# Patient Record
Sex: Female | Born: 1960 | Race: White | Hispanic: No | Marital: Married | Smoking: Never smoker
Health system: Southern US, Community
[De-identification: ages and names within clinical notes are randomized; demographics above are authoritative.]

## PROBLEM LIST (undated history)

## (undated) DIAGNOSIS — R51 Headache: Secondary | ICD-10-CM

## (undated) DIAGNOSIS — H539 Unspecified visual disturbance: Secondary | ICD-10-CM

## (undated) DIAGNOSIS — R519 Headache, unspecified: Secondary | ICD-10-CM

## (undated) DIAGNOSIS — K769 Liver disease, unspecified: Secondary | ICD-10-CM

## (undated) DIAGNOSIS — G35 Multiple sclerosis: Secondary | ICD-10-CM

## (undated) DIAGNOSIS — K754 Autoimmune hepatitis: Secondary | ICD-10-CM

## (undated) DIAGNOSIS — G35D Multiple sclerosis, unspecified: Secondary | ICD-10-CM

## (undated) HISTORY — DX: Headache: R51

## (undated) HISTORY — DX: Unspecified visual disturbance: H53.9

## (undated) HISTORY — PX: OTHER SURGICAL HISTORY: SHX169

## (undated) HISTORY — DX: Autoimmune hepatitis: K75.4

## (undated) HISTORY — DX: Headache, unspecified: R51.9

## (undated) HISTORY — PX: VESICOVAGINAL FISTULA CLOSURE W/ TAH: SUR271

## (undated) HISTORY — PX: ANKLE SURGERY: SHX546

## (undated) HISTORY — DX: Liver disease, unspecified: K76.9

## (undated) HISTORY — DX: Multiple sclerosis, unspecified: G35.D

## (undated) HISTORY — PX: LAPAROSCOPIC APPENDECTOMY: SUR753

## (undated) HISTORY — DX: Multiple sclerosis: G35

---

## 1989-12-19 HISTORY — PX: LAPAROSCOPIC OOPHERECTOMY: SHX6507

## 1989-12-19 HISTORY — PX: TUBAL LIGATION: SHX77

## 2008-11-19 DIAGNOSIS — K759 Inflammatory liver disease, unspecified: Secondary | ICD-10-CM | POA: Insufficient documentation

## 2008-11-19 DIAGNOSIS — K861 Other chronic pancreatitis: Secondary | ICD-10-CM | POA: Insufficient documentation

## 2008-11-19 DIAGNOSIS — G35 Multiple sclerosis: Secondary | ICD-10-CM | POA: Insufficient documentation

## 2010-07-27 ENCOUNTER — Encounter: Admission: RE | Admit: 2010-07-27 | Discharge: 2010-07-27 | Payer: Self-pay | Admitting: Family Medicine

## 2012-06-01 ENCOUNTER — Other Ambulatory Visit: Payer: Self-pay | Admitting: Unknown Physician Specialty

## 2012-06-01 ENCOUNTER — Ambulatory Visit
Admission: RE | Admit: 2012-06-01 | Discharge: 2012-06-01 | Disposition: A | Discharge status: Home / Self Care | Payer: BC Managed Care – PPO | Source: Ambulatory Visit | Attending: Unknown Physician Specialty | Admitting: Unknown Physician Specialty

## 2012-06-01 DIAGNOSIS — T1490XA Injury, unspecified, initial encounter: Secondary | ICD-10-CM

## 2012-06-01 DIAGNOSIS — Z1231 Encounter for screening mammogram for malignant neoplasm of breast: Secondary | ICD-10-CM

## 2012-06-01 DIAGNOSIS — R52 Pain, unspecified: Secondary | ICD-10-CM

## 2012-06-12 ENCOUNTER — Inpatient Hospital Stay: Admission: RE | Admit: 2012-06-12 | Payer: Self-pay | Source: Ambulatory Visit

## 2012-10-29 IMAGING — CR DG KNEE COMPLETE 4+V*L*
4 series · 4 of 4 positions shown · non-contrast
Comparison: None.

CLINICAL DATA: Pain post injury 1 week ago

LEFT KNEE - COMPLETE 4+ VIEW

[view not recorded (1 of 4)]
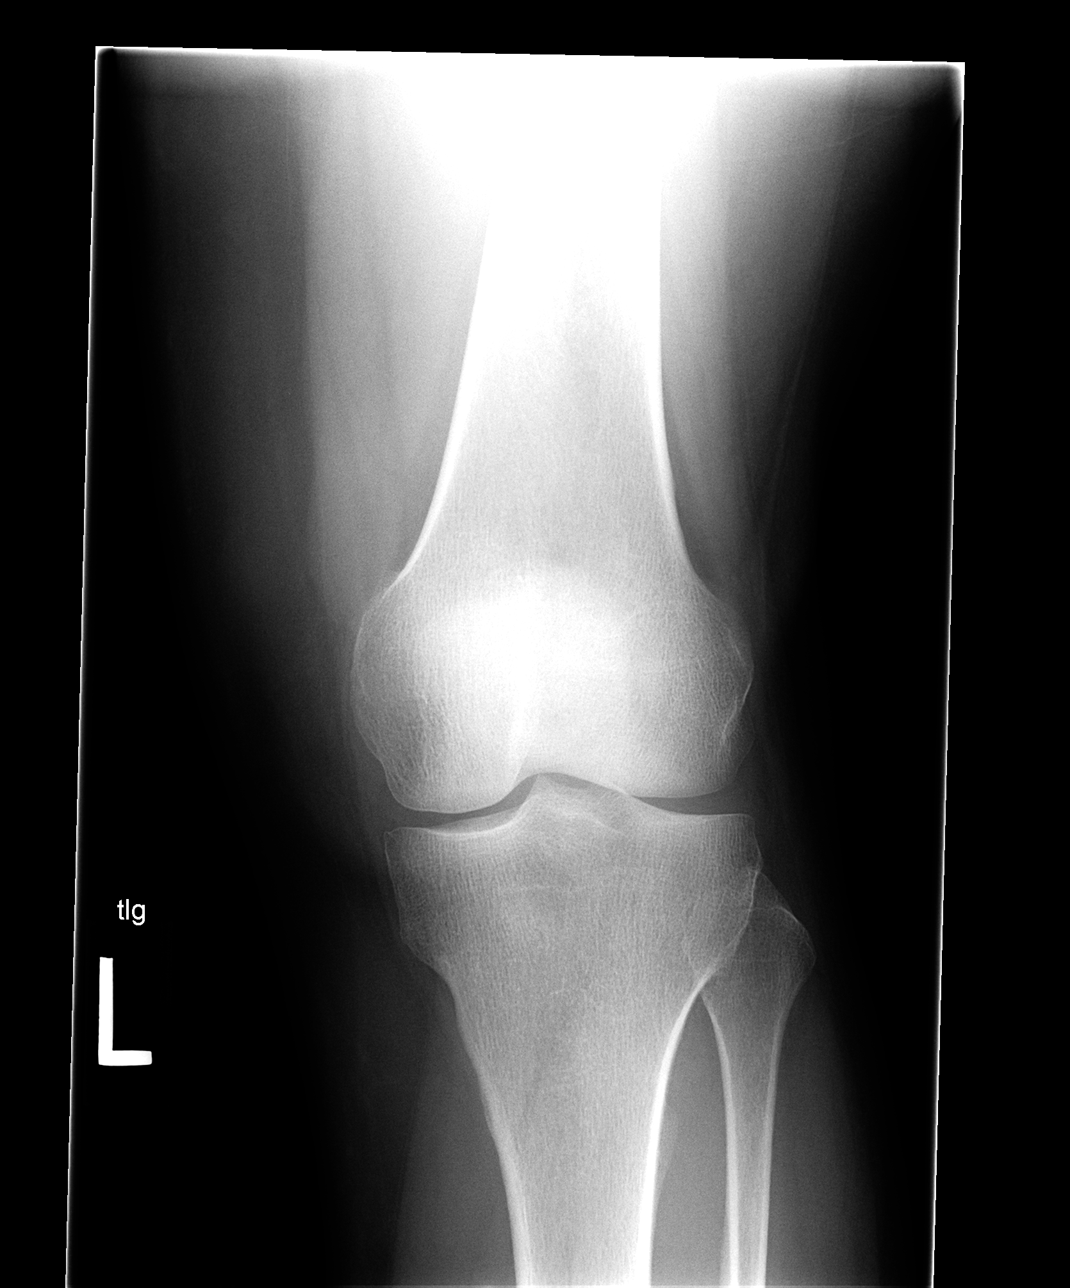

[view not recorded (2 of 4)]
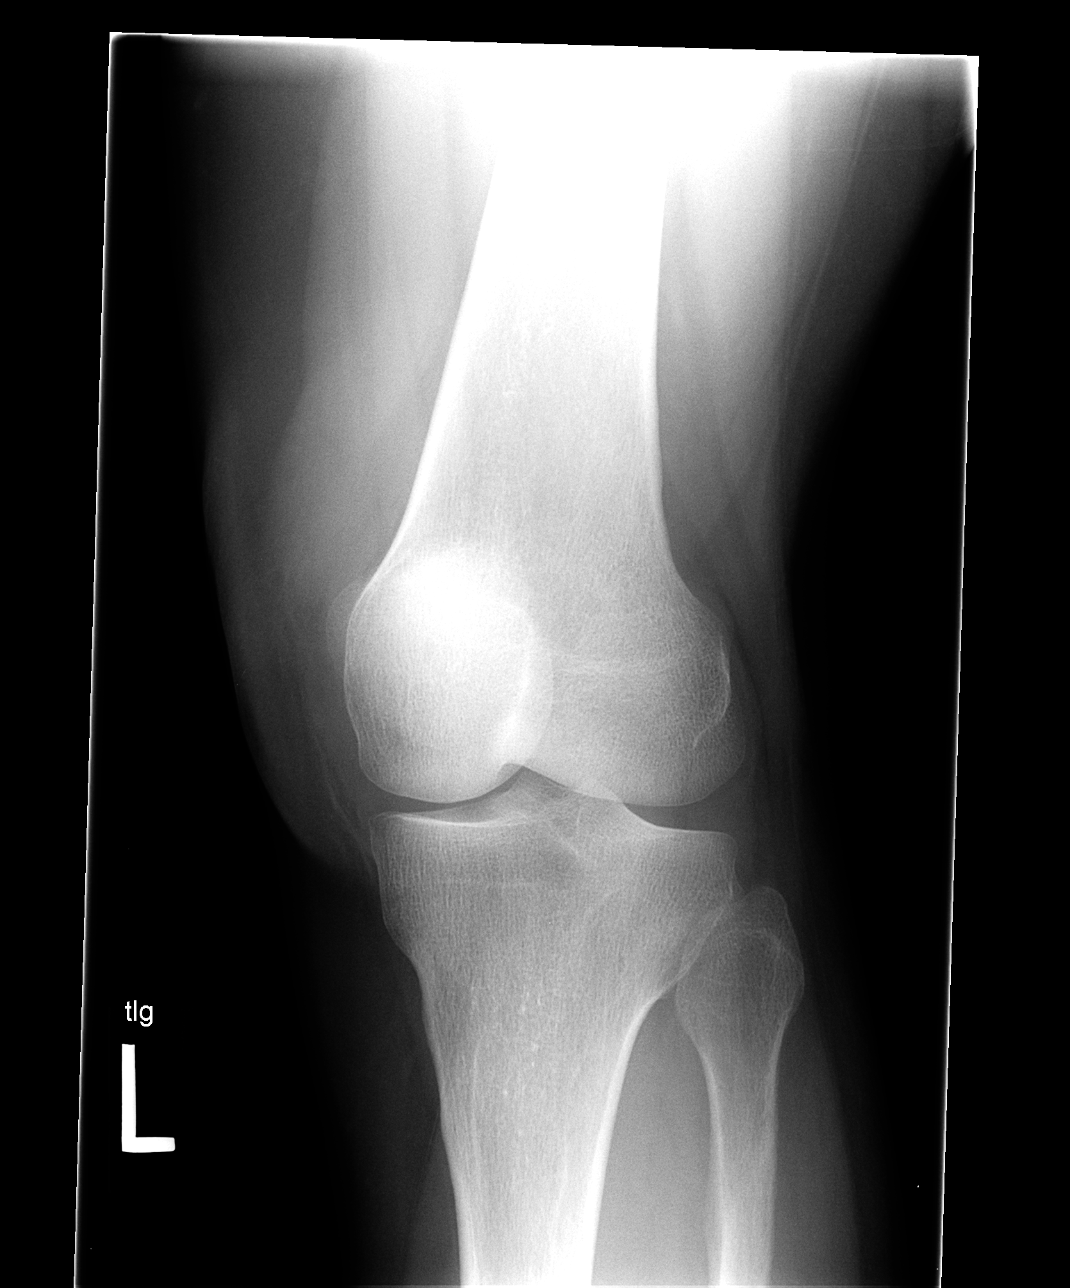

[view not recorded (3 of 4)]
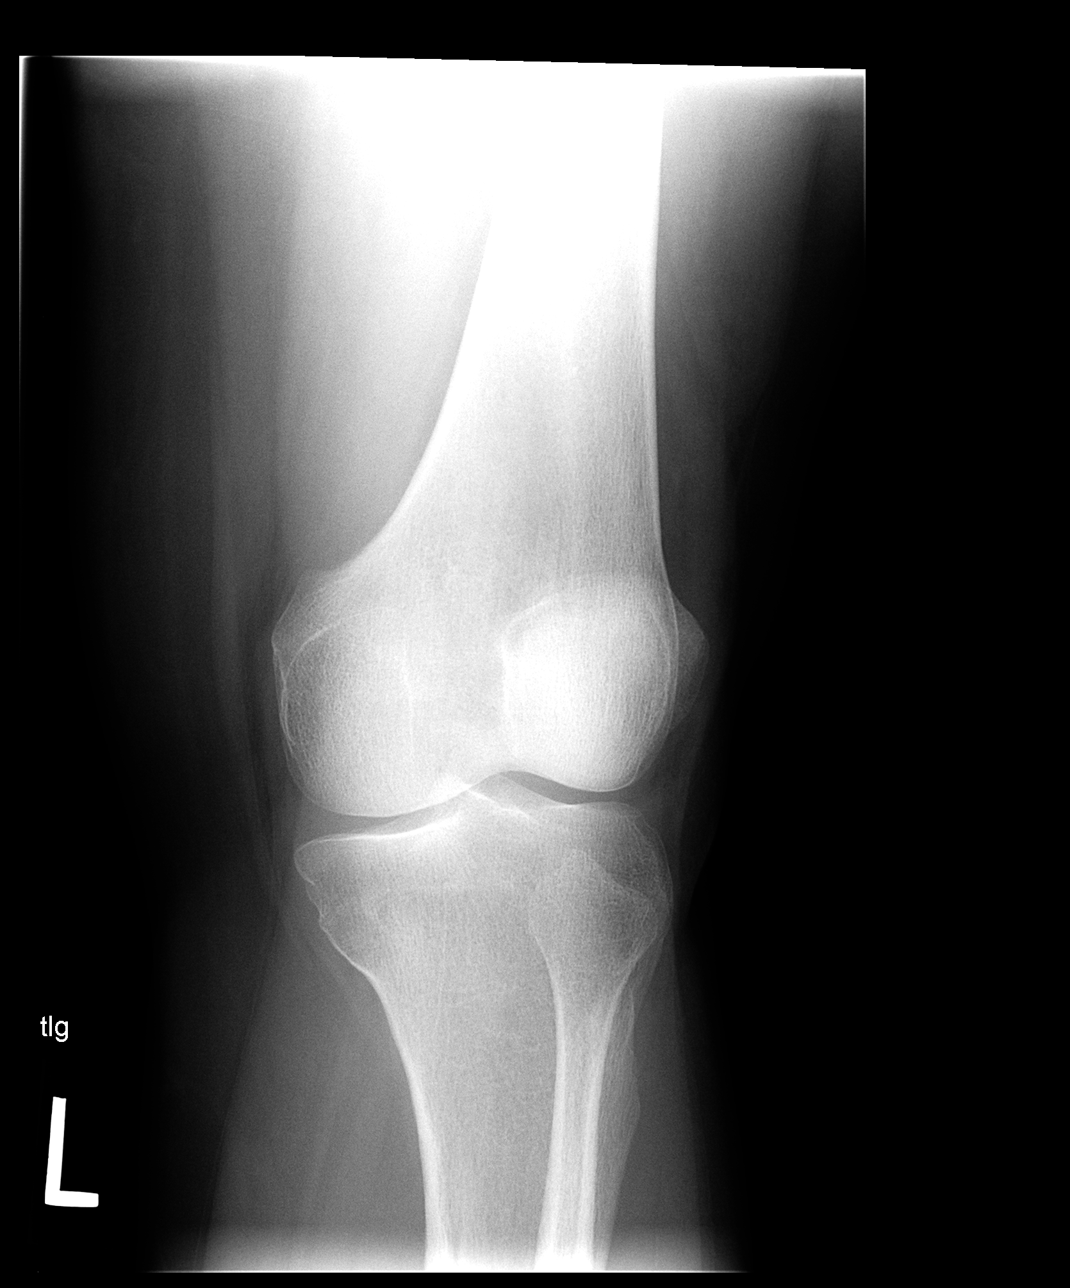

[view not recorded (4 of 4)]
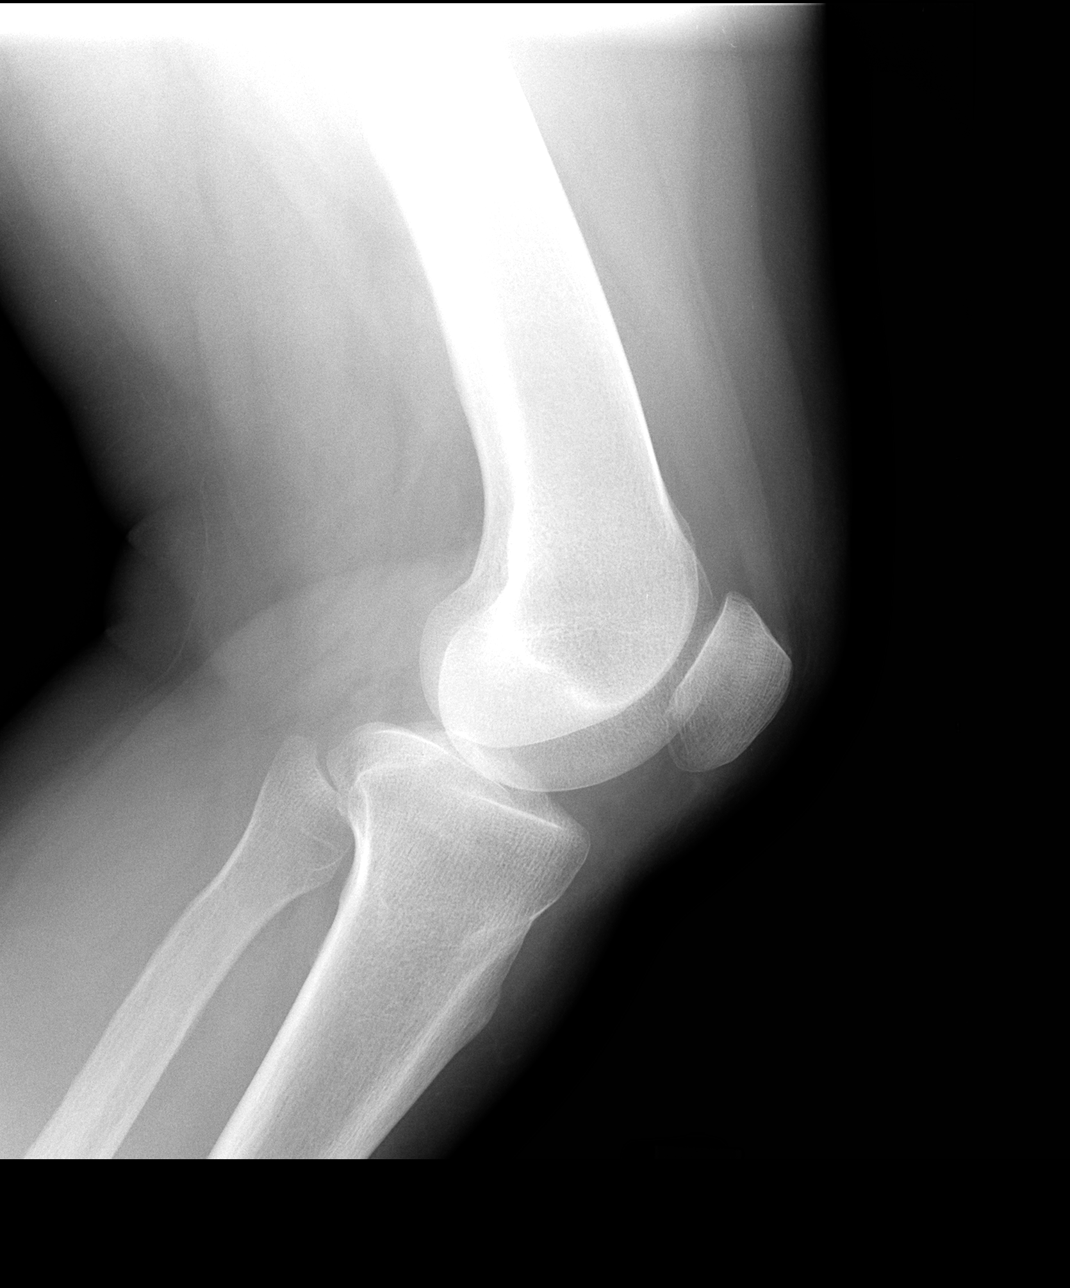

[4 of 4 positions shown; findings below may reference images not displayed]

FINDINGS: Four views of the left knee submitted.  No acute fracture
or subluxation.  Minimal narrowing of medial joint compartment.  No
pathologic calcifications are noted.  No joint effusion.  No
radiopaque foreign body.
IMPRESSION: No acute fracture or subluxation.  Minimal narrowing of medial
joint compartment.  No joint effusion.

## 2013-09-10 DIAGNOSIS — K7200 Acute and subacute hepatic failure without coma: Secondary | ICD-10-CM | POA: Insufficient documentation

## 2013-09-17 DIAGNOSIS — K754 Autoimmune hepatitis: Secondary | ICD-10-CM | POA: Insufficient documentation

## 2013-09-17 DIAGNOSIS — D696 Thrombocytopenia, unspecified: Secondary | ICD-10-CM | POA: Insufficient documentation

## 2013-09-17 DIAGNOSIS — F329 Major depressive disorder, single episode, unspecified: Secondary | ICD-10-CM | POA: Insufficient documentation

## 2013-09-17 DIAGNOSIS — F32A Depression, unspecified: Secondary | ICD-10-CM | POA: Insufficient documentation

## 2013-09-17 DIAGNOSIS — E039 Hypothyroidism, unspecified: Secondary | ICD-10-CM | POA: Insufficient documentation

## 2014-03-25 ENCOUNTER — Other Ambulatory Visit: Payer: Self-pay | Admitting: Family Medicine

## 2014-03-25 ENCOUNTER — Ambulatory Visit (INDEPENDENT_AMBULATORY_CARE_PROVIDER_SITE_OTHER): Payer: Medicare Other

## 2014-03-25 DIAGNOSIS — R229 Localized swelling, mass and lump, unspecified: Principal | ICD-10-CM

## 2014-03-25 DIAGNOSIS — IMO0002 Reserved for concepts with insufficient information to code with codable children: Secondary | ICD-10-CM

## 2014-03-25 DIAGNOSIS — R222 Localized swelling, mass and lump, trunk: Secondary | ICD-10-CM

## 2014-03-26 ENCOUNTER — Other Ambulatory Visit: Payer: Self-pay | Admitting: Family Medicine

## 2014-03-26 DIAGNOSIS — Z1231 Encounter for screening mammogram for malignant neoplasm of breast: Secondary | ICD-10-CM

## 2014-03-27 ENCOUNTER — Ambulatory Visit (INDEPENDENT_AMBULATORY_CARE_PROVIDER_SITE_OTHER): Payer: BC Managed Care – PPO

## 2014-03-27 DIAGNOSIS — Z1231 Encounter for screening mammogram for malignant neoplasm of breast: Secondary | ICD-10-CM

## 2014-08-22 IMAGING — US US MISC SOFT TISSUE
1 series · 9 of 9 positions shown · non-contrast
Comparison: None.

CLINICAL DATA: Palpable nodule in the right upper chest, which is
reported as not breast tissue.

EXAM:
SOFT TISSUE ULTRASOUND - MISCELLANEOUS
TECHNIQUE: Grayscale and color Doppler images were performed of the right upper
chest.

[Series 1: us misc soft tissue · 0.06mm/px · 9 of 9 slices shown]
[im 1/9]
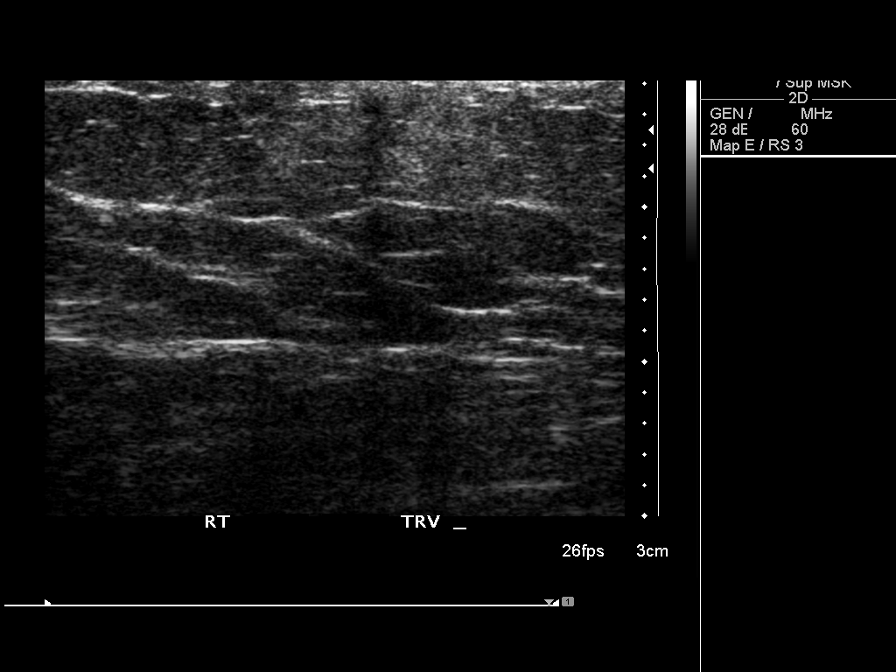
[im 2/9]
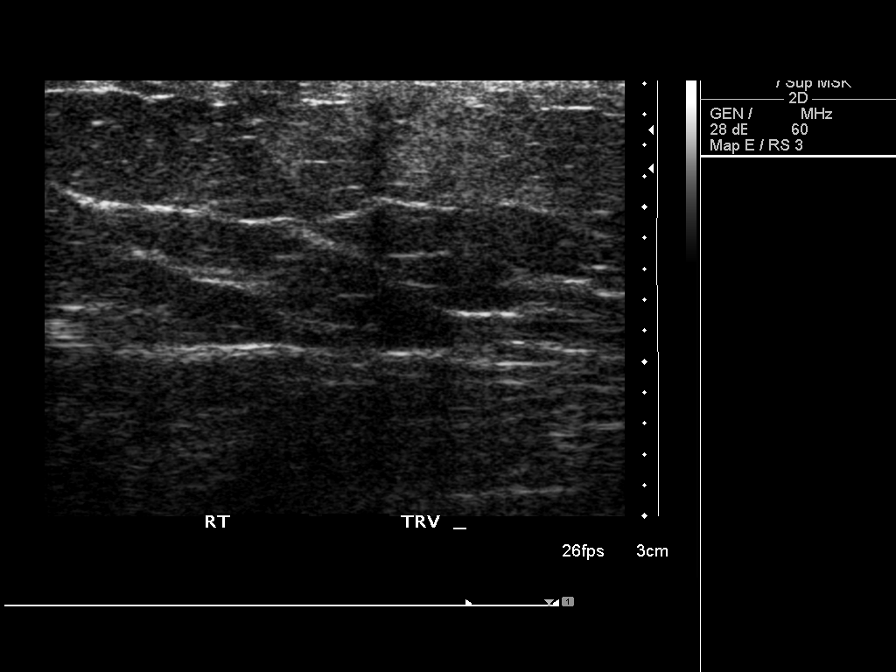
[im 3/9]
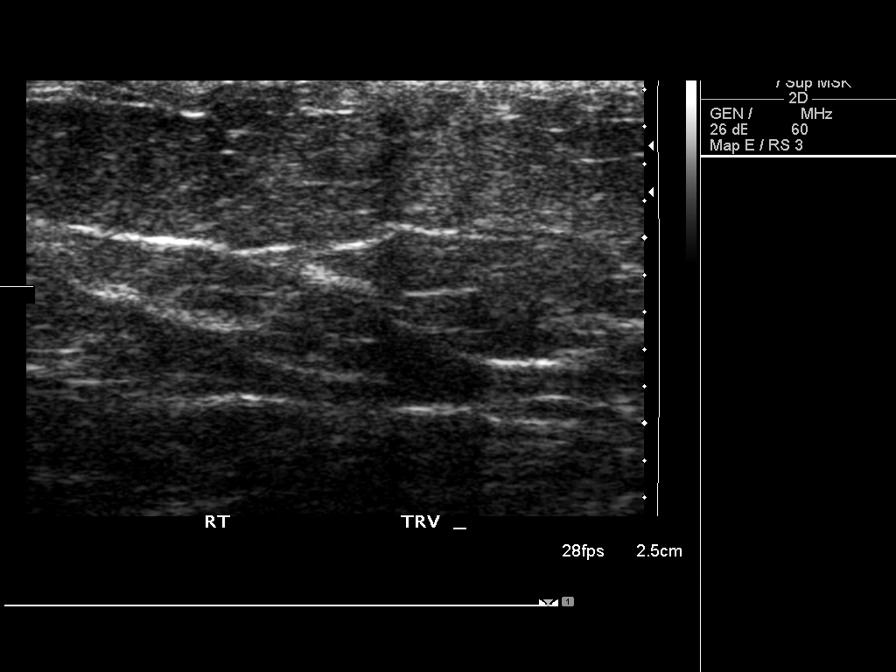
[im 4/9]
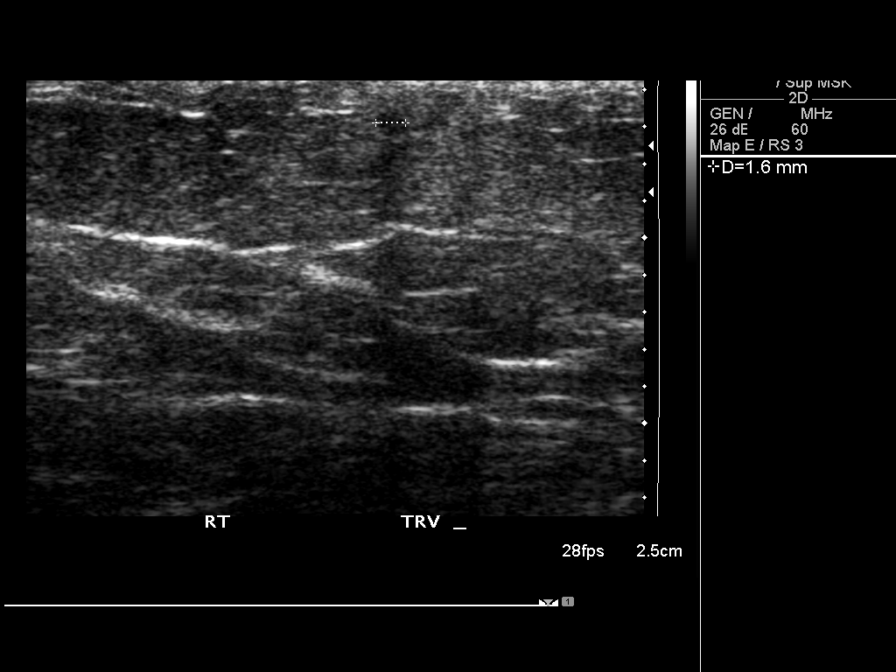
[im 5/9]
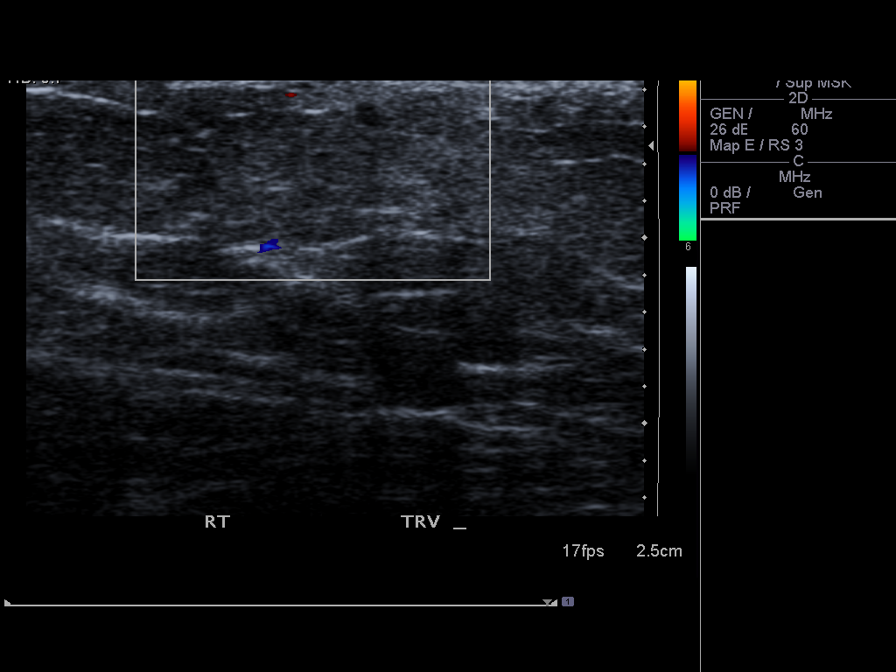
[im 6/9]
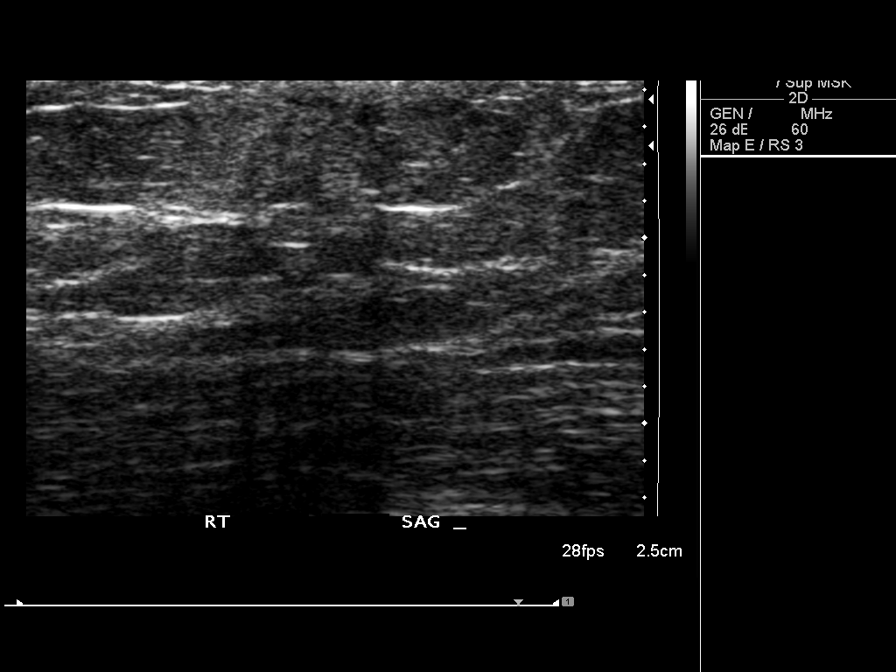
[im 7/9]
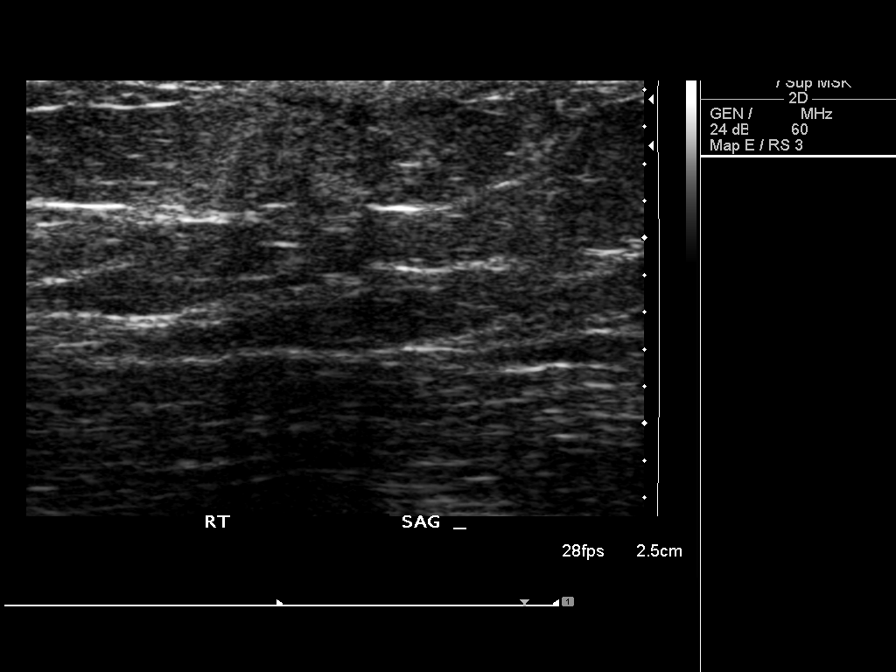
[im 8/9]
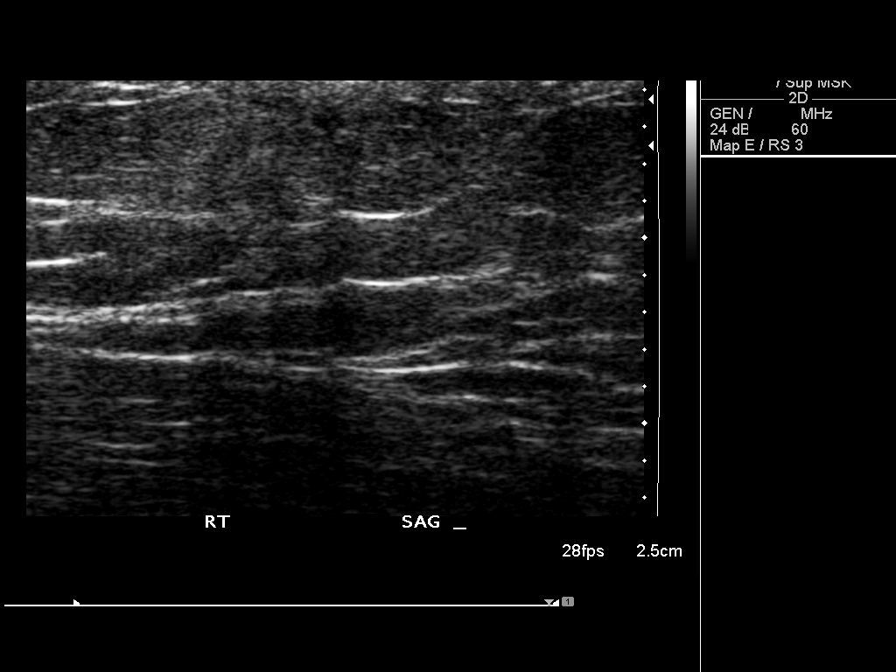
[im 9/9]
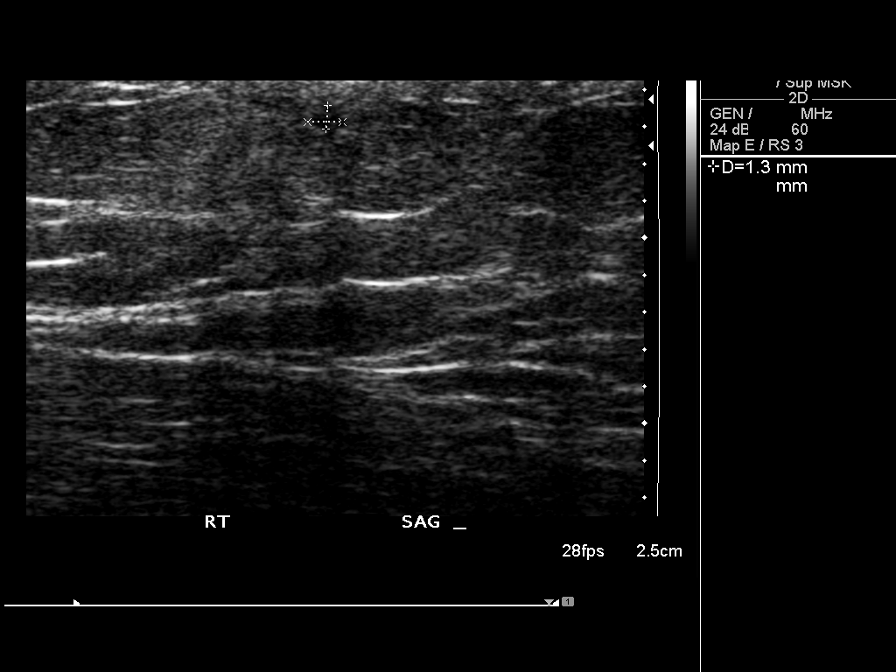

[9 of 9 positions shown; findings below may reference images not displayed]

FINDINGS: There is a small superficial nonspecific hypoechoic nodule at site
of palpable concern in the right upper chest measuring 0.2 x 0.1 x
0.2 cm. This is too small to characterize.
IMPRESSION: Nonspecific tiny superficial nodule at site of palpable in the right
upper chest at site of palpable concern measuring 0.2 cm. (if this
small nodule is felt to reside within the breast (although it is
reported as not breast tissue), then the patient could be referred
for diagnostic mammogram/ possible ultrasound.

## 2015-04-09 ENCOUNTER — Telehealth: Payer: Self-pay | Admitting: Neurology

## 2015-04-09 NOTE — Telephone Encounter (Signed)
LMOM for Phoebie (identified vm) that I have not received med. records/fim

## 2015-04-09 NOTE — Telephone Encounter (Signed)
Patient called and wanted to know if we received her medical record. Please call and advice.  DW

## 2015-09-24 ENCOUNTER — Ambulatory Visit (INDEPENDENT_AMBULATORY_CARE_PROVIDER_SITE_OTHER): Payer: BLUE CROSS/BLUE SHIELD | Admitting: Neurology

## 2015-09-24 ENCOUNTER — Encounter: Payer: Self-pay | Admitting: Neurology

## 2015-09-24 VITALS — BP 110/64 | HR 60 | Resp 14 | Ht 63.0 in | Wt 199.0 lb

## 2015-09-24 DIAGNOSIS — R208 Other disturbances of skin sensation: Secondary | ICD-10-CM | POA: Diagnosis not present

## 2015-09-24 DIAGNOSIS — F329 Major depressive disorder, single episode, unspecified: Secondary | ICD-10-CM | POA: Diagnosis not present

## 2015-09-24 DIAGNOSIS — R269 Unspecified abnormalities of gait and mobility: Secondary | ICD-10-CM | POA: Insufficient documentation

## 2015-09-24 DIAGNOSIS — Z8669 Personal history of other diseases of the nervous system and sense organs: Secondary | ICD-10-CM

## 2015-09-24 DIAGNOSIS — B029 Zoster without complications: Secondary | ICD-10-CM | POA: Diagnosis not present

## 2015-09-24 DIAGNOSIS — R5383 Other fatigue: Secondary | ICD-10-CM | POA: Diagnosis not present

## 2015-09-24 DIAGNOSIS — G35 Multiple sclerosis: Secondary | ICD-10-CM | POA: Diagnosis not present

## 2015-09-24 DIAGNOSIS — G4700 Insomnia, unspecified: Secondary | ICD-10-CM

## 2015-09-24 DIAGNOSIS — F32A Depression, unspecified: Secondary | ICD-10-CM

## 2015-09-24 MED ORDER — GABAPENTIN 300 MG PO CAPS: 300.0000 mg | ORAL_CAPSULE | Freq: Three times a day (TID) | ORAL | Status: DC

## 2015-09-24 MED ORDER — MODAFINIL 200 MG PO TABS: 200.0000 mg | ORAL_TABLET | Freq: Every day | ORAL | Status: AC

## 2015-09-24 NOTE — Progress Notes (Signed)
[facility]  PATIENT: Margan Elias DOB: Apr 23, 1961  REFERRING DOCTOR OR PCP:  Loleta Dicker  SOURCE: patient and records from [facility]  _________________________________   HISTORICAL  CHIEF COMPLAINT:  Chief Complaint  Patient presents with  . Multiple Sclerosis    Former pt. of Dr. Bonnita Hollow from [facility].  Sts. dx. 1993.  Presenting sx. optic neuritis (left), and visual loss.   She is not on any MS therapy--sts.  has been unable to tolerate the meds she has tried.  Sts. h/a's have been worse over the last several weeks.  Sts. she continues Topamax for h/a's, no rescue med--takes otc Aleve prn/fim  . Migraines  . Back Pain    History of discectomy L4-5.  Sts. lbp worsened several mos. ago.  Sts. she had an mri was done in [place]--unable to recall name of facility but will let me know.  She has signed a med. rec. release for mri/fim    HISTORY OF PRESENT ILLNESS:  Lyrika Souders is a 54 year old woman with multiple sclerosis and autoimmune hepatitis who I have seen in the past.   She is not on any DMT at this time but is on Imuran for autoimmune hepatitis.   She feels her MS has been stable.   However, she has pain in her entire spine.  Pain seems more like nerve pain rather than in the spine.   She has allodynia when her back is rubbed.    Her last MRI was 06/2014 and showed no new lesions.       Shingles:   She had shingles late August 2016 and still has some skin changes and some allodynia.   Her left leg feels slightly weak.   She also had shingles in the past associated with her first round of Imuran for the autoimmune hepatitis.    Gait/strength/sensation:    She feels gait is ok for the most part but sometimes feels dizzy.   No actual falls.   She notes mild left leg weakness since the shingles 6 weeks ago.  She notes dysesthetic pain in the left leg and in the entire back.      Vision:   She denies any MS related visual  changes.  Bladder/bowel: She gets frequent urinary tract infections, about 4 a year. She has noted that she will get the urge to urinate and sometimes will have leakage. She does not feel that she completely empties her bladder. Oxybutynin has helped her in the past.  Fatigue/sleep:    She notes fatigue.    Provigil has helped but if she takes too late, she has trouble falling asleep.    She sleeps ok most nights  Mood/cognition:    She denies depression or anxiety.  .    She notes no major problems with cognition .   However, her focus/attention is poor at times.     She occasionally has trouble with recall.      MS history: She was diagnosed with MS in 1993 after presenting with left optic neuritis. MRI was consistent with MS and she was started on Betaseron due to a lapse in insurance, she went 2 years without treatment and restarted round 2000. At some point she was on Avonex and Copaxone. He had injection reactions with both of those medications. Gilenya was tried but she had liver failure (this may have been due to and Augmentin allergy as she was on both medications at the same time).   She was  placed on Imuran for autoimmune hepatitis she did not resume any MS medication at that time.   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   Prednisone was also started    REVIEW OF SYSTEMS: Constitutional: No fevers, chills, sweats, or change in appetite Eyes: No visual changes, double vision, eye pain Ear, nose and throat: No hearing loss, ear pain, nasal congestion, sore throat Cardiovascular: No chest pain, palpitations Respiratory: No shortness of breath at rest or with exertion.   No wheezes GastrointestinaI: No nausea, vomiting, diarrhea, abdominal pain, fecal incontinence Genitourinary: No dysuria, urinary retention or frequency.  No nocturia. Musculoskeletal: No neck pain, back pain Integumentary: No rash, pruritus, skin  lesions Neurological: as above Psychiatric: No depression at this time.  No anxiety Endocrine: No palpitations, diaphoresis, change in appetite, change in weigh or increased thirst Hematologic/Lymphatic: No anemia, purpura, petechiae. Allergic/Immunologic: No itchy/runny eyes, nasal congestion, recent allergic reactions, rashes  ALLERGIES: Allergies  Allergen Reactions  . Amoxicillin-Pot Clavulanate Other (See Comments)    Pt. States the doctors think this is what caused her liver failure  . Codeine Nausea And Vomiting  . Carbamazepine Rash    HOME MEDICATIONS:  Current outpatient prescriptions:  .  atorvastatin (LIPITOR) 10 MG tablet, Take 10 mg by mouth daily., Disp: , Rfl: 1 .  clonazePAM (KLONOPIN) 0.5 MG tablet, by mouth as needed, Disp: , Rfl:  .  cyclobenzaprine (FLEXERIL) 10 MG tablet, TAKE 1/2 TO 1 TABLET THREE TIMES DAILY AS NEEDED FOR SPASM., Disp: , Rfl: 0 .  FLUoxetine (PROZAC) 20 MG capsule, , Disp: , Rfl:  .  levothyroxine (SYNTHROID, LEVOTHROID) 75 MCG tablet, Take 75 mcg by mouth daily., Disp: , Rfl: 1 .  temazepam (RESTORIL) 15 MG capsule, Take by mouth., Disp: , Rfl:  .  topiramate (TOPAMAX) 100 MG tablet, Take 100 mg by mouth 2 (two) times daily., Disp: , Rfl: 3  PAST MEDICAL HISTORY: Past Medical History  Diagnosis Date  . Headache   . Liver disease   . Autoimmune hepatitis (HCC)   . Multiple sclerosis (HCC)   . Vision abnormalities     PAST SURGICAL HISTORY: Past Surgical History  Procedure Laterality Date  . Tubal ligation  1991  . Laparoscopic oopherectomy Right 1991  . Ankle surgery  Approx. 2006  . Laparoscopic appendectomy    . Vesicovaginal fistula closure w/ tah    . Discectomy      L4-L5    FAMILY HISTORY: Family History  Problem Relation Age of Onset  . Heart attack Mother   . High Cholesterol Mother   . Hypertension Mother   . Diabetes type II Mother   . Heart attack Father   . High Cholesterol Father   . Hypertension Father    . Lupus Sister   . Heart failure Sister   . Seizures Sister   . Pancreatitis Brother   . Multiple sclerosis Maternal Aunt     SOCIAL HISTORY:  Social History   Social History  . Marital Status: Married    Spouse Name: N/A  . Number of Children: N/A  . Years of Education: N/A   Occupational History  . Not on file.   Social History Main Topics  . Smoking status: Never Smoker   . Smokeless tobacco: Not on file  . Alcohol Use: No  . Drug Use: No  . Sexual Activity: Not on file   Other Topics Concern  . Not on  file   Social History Narrative  . No narrative on file     PHYSICAL EXAM  Filed Vitals:   09/24/15 1512  BP: 110/64  Pulse: 60  Resp: 14  Height:  (1.6 m)  Weight: 199 lb (90.266 kg)    Body mass index is 35.26 kg/(m^2).   General: The patient is well-developed and well-nourished and in no acute distress  Eyes:  Funduscopic exam shows shows left optic pallor.    Normal retinal vessels.  Neck: The neck is supple, no carotid bruits are noted.  The neck is nontender.  Cardiovascular: The heart has a regular rate and rhythm with a normal S1 and S2. There were no murmurs, gallops or rubs. Lungs are clear to auscultation.  Skin: Extremities are without significant edema.  Musculoskeletal:  Back is nontender  Neurologic Exam  Mental status: The patient is alert and oriented x 3 at the time of the examination. The patient has apparent normal recent and remote memory, with an apparently normal attention span and concentration ability.   Speech is normal.  Cranial nerves: Extraocular movements are full. Pupils show 1+ left APD.  Visual fields are reduced left eye upper left quadrant.  Facial symmetry is present. There is good facial sensation to soft touch bilaterally.Facial strength is normal.  Trapezius and sternocleidomastoid strength is normal. No dysarthria is noted.  The tongue is midline, and the patient has symmetric elevation of the soft  palate. No obvious hearing deficits are noted.  Motor:  Muscle bulk is normal.   Tone is normal. Strength is  5 / 5 in all 4 extremities.   Sensory: Sensory testing shows allodynia bilaterally on her back and left thigh.   Otherwise, she is intact to pinprick, soft touch and vibration sensation in all 4 extremities.  Coordination: Cerebellar testing reveals good finger-nose-finger and heel-to-shin bilaterally.  Gait and station: Station is normal.   Gait is minimally normal. Tandem gait is wide . Romberg is negative.   Reflexes: Deep tendon reflexes are symmetric and normal bilaterally.       DIAGNOSTIC DATA (LABS, IMAGING, TESTING) - I reviewed patient records, labs, notes, testing and imaging myself where available.      ASSESSMENT AND PLAN  Multiple sclerosis (HCC)  Clinical depression  Dysesthesia  Gait disturbance  Insomnia  Other fatigue  Shingles  History of optic neuritis   1.    Continue off med's.  She understands that there are risks of major exacerbation being off of medications.   However, as she has a liver disorder and isn't ready on Imuran the risk of most therapy is probably higher that the benefit. Copaxone would be safe but she has not tolerated in the past. 2.   MRI brain next year.   If significant changes are noted, we will reconsider therapy. 3.   Gabapentin to help with the dysesthesias 4.   Return in 6 months or sooner if there are new or worsening neurologic symptoms.  45 minute face-to-face evaluation with greater than one half of the time counseling and coordinating care about her MS, symptoms and other issues  Aquarius Latouche A. Epimenio Foot, MD, PhD 09/24/2015, 3:19 PM Certified in Neurology, Clinical Neurophysiology, Sleep Medicine, Pain Medicine and Neuroimaging  [facility] [address] [place], [place] [address] [phone]

## 2016-09-22 ENCOUNTER — Encounter: Payer: Self-pay | Admitting: Neurology

## 2016-09-22 ENCOUNTER — Ambulatory Visit (INDEPENDENT_AMBULATORY_CARE_PROVIDER_SITE_OTHER): Payer: BLUE CROSS/BLUE SHIELD | Admitting: Neurology

## 2016-09-22 VITALS — BP 142/80 | HR 76 | Resp 16 | Ht 63.0 in | Wt 204.0 lb

## 2016-09-22 DIAGNOSIS — R269 Unspecified abnormalities of gait and mobility: Secondary | ICD-10-CM | POA: Diagnosis not present

## 2016-09-22 DIAGNOSIS — G35 Multiple sclerosis: Secondary | ICD-10-CM

## 2016-09-22 DIAGNOSIS — R5383 Other fatigue: Secondary | ICD-10-CM | POA: Diagnosis not present

## 2016-09-22 DIAGNOSIS — Z8669 Personal history of other diseases of the nervous system and sense organs: Secondary | ICD-10-CM | POA: Diagnosis not present

## 2016-09-22 DIAGNOSIS — K754 Autoimmune hepatitis: Secondary | ICD-10-CM

## 2016-09-22 DIAGNOSIS — R208 Other disturbances of skin sensation: Secondary | ICD-10-CM | POA: Diagnosis not present

## 2016-09-22 NOTE — Progress Notes (Signed)
[facility]  PATIENT: Allison Hanson DOB: 1961/03/31  REFERRING DOCTOR OR PCP:  Loleta Dicker  SOURCE: patient and records from [facility]  _________________________________   HISTORICAL  CHIEF COMPLAINT:  Chief Complaint  Patient presents with  . Multiple Sclerosis    She is not on any MS med.  She c/o new symptom--feeling as if vision in right eye is "shaded".  Also c/o intermittent sharp stabbing pain left side of her head the last couple of days. She brought results from labs done recently by pcp/fim    HISTORY OF PRESENT ILLNESS:  Allison Hanson is a 55 year old woman with multiple sclerosis and autoimmune hepatitis who I have seen in the past.   She is not on any DMT at this time but is on Imuran for autoimmune hepatitis.   She feels her MS has been stable.   However, she has pain in her entire spine.  Pain seems more like nerve pain rather than in the spine.   She has allodynia when her back is rubbed.    Her last MRI was 06/2014 and showed no new lesions.       Gait/strength/sensation:    She feels gait is stable though balance is sometimes off.    She has stumbled but no actual falls.  She notes some mild numbness in her feet and sometimes in her fingers.    She notes left leg weakness and minimal left arm weakness.  Vision:   She has noted right eye vision seems dimmer in darker rooms.    However, colors are not desaturated in normal light.    Bladder/bowel: She has frequent urinary tract infections, about 4 a year. She has had mild incontinence at times.    Fatigue/sleep:    She notes fatigue, worse with heat.   She had some sleepiness which is better with a change in jobs.    Provigil has helped but if she takes too late, she has trouble falling asleep.    She sleeps ok most nights.   She snores sometimes but has never been observed to snort/gasp or have pauses  Mood/cognition:    She denies depression or anxiety.  .    She notes no major  problems with cognition .   However, her focus/attention is poor at times.     She occasionally has trouble with recall.    Learning new things seems harder.     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   She is on Imuran.      Shingles:   She had shingles late August 2016 had allodynia/dysesthesia in her left thigh x many months but it finally resolved and she is off gabapentin.    She also had shingles at age 55.     MS history: She was diagnosed with MS in 1993 after presenting with left optic neuritis. MRI was consistent with MS and she was started on Betaseron due to a lapse in insurance, she went 2 years without treatment and restarted round 2000. At some point she was on Avonex and Copaxone. He had injection reactions with both of those medications. Gilenya was tried but she had liver failure (this may have been due to and Augmentin allergy as she was on both medications at the same time).   She was placed on Imuran for autoimmune hepatitis she did not resume any MS medication at that time.   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   Prednisone was also started    REVIEW OF SYSTEMS: Constitutional: No fevers, chills, sweats, or change in appetite.   She has fatigue Eyes: No visual changes, double vision, eye pain Ear, nose and throat: No hearing loss, ear pain, nasal congestion, sore throat Cardiovascular: No chest pain, palpitations Respiratory: No shortness of breath at rest or with exertion.   No wheezes GastrointestinaI: She has autoimmune hepatitis.  No nausea, vomiting, diarrhea, abdominal pain, fecal incontinence Genitourinary: No dysuria, urinary retention or frequency.  No nocturia. Musculoskeletal: No neck pain, back pain Integumentary: No rash, pruritus, skin lesions Neurological: as above Psychiatric: No depression at this time.  No anxiety Endocrine: No palpitations,  diaphoresis, change in appetite, change in weigh or increased thirst Hematologic/Lymphatic: No anemia, purpura, petechiae. Allergic/Immunologic: No itchy/runny eyes, nasal congestion, recent allergic reactions, rashes  ALLERGIES: Allergies  Allergen Reactions  . Amoxicillin-Pot Clavulanate Other (See Comments)    Pt. States the doctors think this is what caused her liver failure  . Codeine Nausea And Vomiting  . Carbamazepine Rash    HOME MEDICATIONS:  Current Outpatient Prescriptions:  .  atorvastatin (LIPITOR) 10 MG tablet, Take 10 mg by mouth daily., Disp: , Rfl: 1 .  azaTHIOprine (IMURAN) 50 MG tablet, Take 50 mg by mouth daily., Disp: , Rfl:  .  clonazePAM (KLONOPIN) 0.5 MG tablet, by mouth as needed, Disp: , Rfl:  .  cyclobenzaprine (FLEXERIL) 10 MG tablet, TAKE 1/2 TO 1 TABLET THREE TIMES DAILY AS NEEDED FOR SPASM., Disp: , Rfl: 0 .  FLUoxetine (PROZAC) 20 MG capsule, , Disp: , Rfl:  .  gabapentin (NEURONTIN) 300 MG capsule, Take 1 capsule (300 mg total) by mouth 3 (three) times daily., Disp: 90 capsule, Rfl: 11 .  levothyroxine (SYNTHROID, LEVOTHROID) 75 MCG tablet, Take 75 mcg by mouth daily., Disp: , Rfl: 1 .  modafinil (PROVIGIL) 200 MG tablet, Take 1 tablet (200 mg total) by mouth daily., Disp: 30 tablet, Rfl: 5 .  temazepam (RESTORIL) 15 MG capsule, Take by mouth., Disp: , Rfl:  .  topiramate (TOPAMAX) 100 MG tablet, Take 100 mg by mouth 2 (two) times daily., Disp: , Rfl: 3  PAST MEDICAL HISTORY: Past Medical History:  Diagnosis Date  . Autoimmune hepatitis (HCC)   . Headache   . Liver disease   . Multiple sclerosis (HCC)   . Vision abnormalities     PAST SURGICAL HISTORY: Past Surgical History:  Procedure Laterality Date  . ANKLE SURGERY  Approx. 2006  . discectomy     L4-L5  . LAPAROSCOPIC APPENDECTOMY    . LAPAROSCOPIC OOPHERECTOMY Right 1991  . TUBAL LIGATION  1991  . VESICOVAGINAL FISTULA CLOSURE W/ TAH      FAMILY HISTORY: Family History  Problem  Relation Age of Onset  . Heart attack Mother   . High Cholesterol Mother   . Hypertension Mother   . Diabetes type II Mother   . Heart attack Father   . High Cholesterol Father   . Hypertension Father   . Lupus Sister   . Heart failure Sister   . Seizures Sister   . Pancreatitis Brother   . Multiple sclerosis Maternal Aunt     SOCIAL HISTORY:  Social History   Social History  . Marital status: Married    Spouse name: N/A  . Number of children: N/A  . Years of education: N/A   Occupational History  .  Not on file.   Social History Main Topics  . Smoking status: Never Smoker  . Smokeless tobacco: Not on file  . Alcohol use No  . Drug use: No  . Sexual activity: Not on file   Other Topics Concern  . Not on file   Social History Narrative  . No narrative on file     PHYSICAL EXAM  Vitals:   09/22/16 1309  BP: (!) 142/80  Pulse: 76  Resp: 16  Weight: 204 lb (92.5 kg)  Height: 5\' 3"  (1.6 m)    Body mass index is 36.14 kg/m.   General: The patient is well-developed and well-nourished and in no acute distress  Eyes:  Funduscopic exam shows shows left optic pallor.    Normal retinal vessels.  Neurologic Exam  Mental status: The patient is alert and oriented x 3 at the time of the examination. The patient has apparent normal recent and remote memory, with an apparently normal attention span and concentration ability.   Speech is normal.  Cranial nerves: Extraocular movements are full. Pupils show 1+ left APD.  Colors are symmetric  Facial symmetry is present. There is good facial sensation to soft touch bilaterally.Facial strength is normal.  Trapezius and sternocleidomastoid strength is normal. No dysarthria is noted.  The tongue is midline, and the patient has symmetric elevation of the soft palate. No obvious hearing deficits are noted.  Motor:  Muscle bulk is normal.   Tone is normal. Strength is  5 / 5 in all 4 extremities.   Sensory: Sensory testing  shows allodynia bilaterally on her back and left thigh.   Otherwise, she is intact to pinprick, soft touch and vibration sensation in all 4 extremities.  Coordination: Cerebellar testing reveals good finger-nose-finger and mildly reduced heel-to-shin bilaterally.  Gait and station: Station is normal.   Gait is minimally normal. Tandem gait is wide . Romberg is negative.   Reflexes: Deep tendon reflexes are symmetric and increased at knees bilaterally.       DIAGNOSTIC DATA (LABS, IMAGING, TESTING) - I reviewed patient records, labs, notes, testing and imaging myself where available.      ASSESSMENT AND PLAN  Multiple sclerosis (HCC)  Gait disturbance  Dysesthesia  Autoimmune hepatitis (HCC)  History of optic neuritis  Other fatigue   1.    Continue off MS disease modifying therapies.  As she has an autoimmune liver disorder she is on Imuran.  The risk of most therapies is probably higher that the benefit. Copaxone would be safe but she did not tolerate in the past.   Imuran may have right benefit for MS.  2.   We will check an MRI of the brain with and without contrast to assess for the possibility of subclinical progression. If this is occurring, we will need to reconsider a disease modifying therapy and I talked to her GI doctor to that we can pick one that would reasonable safety and efficacy 3.   Return in 6 months or sooner if there are new or worsening neurologic symptoms.  Allison Hanson A. Epimenio Foot, MD, PhD 09/22/2016, 1:45 PM Certified in Neurology, Clinical Neurophysiology, Sleep Medicine, Pain Medicine and Neuroimaging  [facility] [address], [address] [place], [place] [address] [phone]

## 2016-10-20 ENCOUNTER — Inpatient Hospital Stay: Admission: RE | Admit: 2016-10-20 | Payer: Medicare Other | Source: Ambulatory Visit

## 2016-11-03 ENCOUNTER — Other Ambulatory Visit: Payer: Self-pay | Admitting: Neurology

## 2016-11-25 ENCOUNTER — Ambulatory Visit
Admission: RE | Admit: 2016-11-25 | Discharge: 2016-11-25 | Disposition: A | Discharge status: Home / Self Care | Payer: BLUE CROSS/BLUE SHIELD | Source: Ambulatory Visit | Attending: Neurology | Admitting: Neurology

## 2016-11-25 DIAGNOSIS — G35 Multiple sclerosis: Secondary | ICD-10-CM

## 2016-11-25 DIAGNOSIS — R269 Unspecified abnormalities of gait and mobility: Secondary | ICD-10-CM

## 2016-11-25 MED ORDER — GADOBENATE DIMEGLUMINE 529 MG/ML IV SOLN
20.0000 mL | Freq: Once | INTRAVENOUS | Status: AC | PRN
  Administered 2016-11-25: 18 mL via INTRAVENOUS

## 2016-11-28 ENCOUNTER — Telehealth: Payer: Self-pay | Admitting: *Deleted

## 2016-11-28 NOTE — Telephone Encounter (Signed)
LMOM (identified vm) that per RAS, he compared her most recent MRI brain with the one done prior to this, and there have been no new lesions, no changes.  She does not need to return this call unless she has questions/fim

## 2016-11-28 NOTE — Telephone Encounter (Signed)
-----   Message from Asa Lenteichard A Sater, MD sent at 11/25/2016  2:05 PM EST ----- Please let her know that the MRI of the brain does not show any new MS plaques or changes compared to last one

## 2016-11-28 NOTE — Telephone Encounter (Signed)
Patient is calling to discuss her MRI. She has questions.

## 2016-11-29 NOTE — Telephone Encounter (Signed)
I have spoken with Allison Hanson and reviewed MRI results with her again/fim

## 2017-02-05 ENCOUNTER — Other Ambulatory Visit: Payer: Self-pay | Admitting: Neurology

## 2017-02-14 ENCOUNTER — Other Ambulatory Visit: Payer: Self-pay | Admitting: Neurology

## 2017-02-26 ENCOUNTER — Telehealth: Payer: Self-pay | Admitting: Neurology

## 2017-03-01 ENCOUNTER — Other Ambulatory Visit: Payer: Self-pay | Admitting: Neurology

## 2017-03-01 MED ORDER — TOPIRAMATE 100 MG PO TABS: 100.0000 mg | ORAL_TABLET | Freq: Two times a day (BID) | ORAL | 1 refills | Status: DC

## 2017-03-01 NOTE — Addendum Note (Signed)
Addended by: Candis Schatz I on: 03/01/2017 02:30 PM   Modules accepted: Orders

## 2017-03-01 NOTE — Telephone Encounter (Signed)
Patient is calling regarding refill for medication topiramate (TOPAMAX) 100 MG tablet that was denied.

## 2017-03-01 NOTE — Telephone Encounter (Signed)
I have spoken with Vicci this afternoon.  Topamax rx. escribed to [facility] per her request/fim

## 2017-08-24 ENCOUNTER — Other Ambulatory Visit: Payer: Self-pay | Admitting: Neurology

## 2017-09-22 ENCOUNTER — Ambulatory Visit: Payer: Medicare Other | Admitting: Neurology

## 2017-11-14 ENCOUNTER — Telehealth: Payer: Self-pay | Admitting: *Deleted

## 2017-11-14 DIAGNOSIS — G35 Multiple sclerosis: Secondary | ICD-10-CM

## 2017-11-14 NOTE — Telephone Encounter (Signed)
LMTC.  I need to r/s her 11/30/17 appt. with RAS.  He will be out of the office that day/fim 

## 2017-11-30 ENCOUNTER — Ambulatory Visit: Payer: Medicare Other | Admitting: Neurology

## 2017-11-30 NOTE — Addendum Note (Signed)
Addended by: Candis Schatz I on: 11/30/2017 01:16 PM   Modules accepted: Orders

## 2017-11-30 NOTE — Telephone Encounter (Signed)
Spoke with Allison Hanson and r/s appt. with RAS.  Ordered MRI, as she has not had one in 2 yrs., and has met her ins. deductible./fim

## 2017-11-30 NOTE — Telephone Encounter (Addendum)
Pt called to verify appt for today. She said she did rec a call on 11/27 but there was no message. Please call to schedule an appt asap at [phone]. Pt will keep the phone with her at all times. Thank you

## 2017-12-13 ENCOUNTER — Ambulatory Visit
Admission: RE | Admit: 2017-12-13 | Discharge: 2017-12-13 | Disposition: A | Discharge status: Home / Self Care | Payer: BLUE CROSS/BLUE SHIELD | Source: Ambulatory Visit | Attending: Neurology | Admitting: Neurology

## 2017-12-13 DIAGNOSIS — G35 Multiple sclerosis: Secondary | ICD-10-CM | POA: Diagnosis not present

## 2017-12-13 MED ORDER — GADOBENATE DIMEGLUMINE 529 MG/ML IV SOLN
19.0000 mL | Freq: Once | INTRAVENOUS | Status: AC | PRN
  Administered 2017-12-13: 19 mL via INTRAVENOUS

## 2017-12-21 ENCOUNTER — Telehealth: Payer: Self-pay | Admitting: *Deleted

## 2017-12-21 NOTE — Telephone Encounter (Signed)
-----   Message from Asa Lente, MD sent at 12/19/2017  4:13 PM EST ----- Please let her know that the MRI of the brain did not show any new lesions.

## 2017-12-21 NOTE — Telephone Encounter (Signed)
I have spoken with Allison Hanson this afternoon and reviewed below MRI result.  She verbalized understanding of same/fim

## 2018-01-01 ENCOUNTER — Ambulatory Visit: Payer: BLUE CROSS/BLUE SHIELD | Admitting: Neurology

## 2018-01-01 ENCOUNTER — Encounter: Payer: Self-pay | Admitting: Neurology

## 2018-01-01 ENCOUNTER — Other Ambulatory Visit: Payer: Self-pay

## 2018-01-01 VITALS — BP 127/69 | HR 60 | Resp 18 | Ht 63.0 in | Wt 207.0 lb

## 2018-01-01 DIAGNOSIS — M542 Cervicalgia: Secondary | ICD-10-CM

## 2018-01-01 DIAGNOSIS — R5383 Other fatigue: Secondary | ICD-10-CM | POA: Diagnosis not present

## 2018-01-01 DIAGNOSIS — K754 Autoimmune hepatitis: Secondary | ICD-10-CM | POA: Diagnosis not present

## 2018-01-01 DIAGNOSIS — G43709 Chronic migraine without aura, not intractable, without status migrainosus: Secondary | ICD-10-CM | POA: Diagnosis not present

## 2018-01-01 DIAGNOSIS — G35 Multiple sclerosis: Secondary | ICD-10-CM

## 2018-01-01 DIAGNOSIS — IMO0002 Reserved for concepts with insufficient information to code with codable children: Secondary | ICD-10-CM | POA: Insufficient documentation

## 2018-01-01 DIAGNOSIS — R269 Unspecified abnormalities of gait and mobility: Secondary | ICD-10-CM | POA: Diagnosis not present

## 2018-01-01 MED ORDER — IMIPRAMINE HCL 25 MG PO TABS: 25.0000 mg | ORAL_TABLET | Freq: Every day | ORAL | 5 refills | Status: DC

## 2018-01-01 MED ORDER — TEMAZEPAM 15 MG PO CAPS: 15.0000 mg | ORAL_CAPSULE | Freq: Every day | ORAL | 5 refills | Status: AC

## 2018-01-01 NOTE — Progress Notes (Signed)
[facility]  PATIENT: Allison Hanson DOB: 1961/08/25  REFERRING DOCTOR OR PCP:  Loleta Dicker  SOURCE: patient and records from [facility]  _________________________________   HISTORICAL  CHIEF COMPLAINT:  Chief Complaint  Patient presents with  . Multiple Sclerosis    On Imuran for liver disease, so not on any MS dmt.  Doesn't think Topamax worksfor her any longer--keeps a daily h/a, and migraines are more frequent, same severity. Having difficulty going to and staying asleep.  Would like to discuss restarting Clonazepam and Temazepam, which Dr. Tinnie Gens used to rx. for her /fim    HISTORY OF PRESENT ILLNESS:  Allison Hanson is a 57 year old woman with multiple sclerosis and autoimmune hepatitis who I have seen in the past.     Update 01/01/2018:   She denies any new MS symptoms.   She is on Imuran for autoimmune hepatitis so has not been on any DMT for a while.   Recently, she had dental pain on the left and saw her dentist who found no issues.   She notes a strange pain.    She had trigeminal neuralgia in the past but the more ecent pain seemed more throbbing.   She also has a toothache type pain in her left leg.   The left leg has always felt a little weak.   There is no numbness.    Pain is deep.    Her balance has been worse.   10/22/2017, she fell after turning and hit back of hr head.   She had a short LOC and concussion.     She is reporting more migraines.   They occur 3-4 times a week.   She was taking TPM 100 mg po bid and it seemed to help at first but less so, even with an increase in dose.   There is occipital tenderness.     She has pain in the left wrist.      She is having more trouble with insomnia.   She has both sleep onset and sleep maintenance issues.   He rhusband sleeps poorly which also makes it more difficult for her.   In the past, she was on Temazepam with a lot of benefit and also on clonazepam with benefit.       From  09/22/2017:She is not on any DMT at this time but is on Imuran for autoimmune hepatitis.   She feels her MS has been stable.   However, she has pain in her entire spine.  Pain seems more like nerve pain rather than in the spine.   She has allodynia when her back is rubbed.    Her last MRI was 06/2014 and showed no new lesions.       Gait/strength/sensation:    She feels gait is stable though balance is sometimes off.    She has stumbled but no actual falls.  She notes some mild numbness in her feet and sometimes in her fingers.    She notes left leg weakness and minimal left arm weakness.  Vision:   She has noted right eye vision seems dimmer in darker rooms.    However, colors are not desaturated in normal light.    Bladder/bowel: She has frequent urinary tract infections, about 4 a year. She has had mild incontinence at times.    Fatigue/sleep:    She notes fatigue, worse with heat.   She had some sleepiness which is better with a change in jobs.  Provigil has helped but if she takes too late, she has trouble falling asleep.    She sleeps ok most nights.   She snores sometimes but has never been observed to snort/gasp or have pauses  Mood/cognition:    She denies depression or anxiety.  .    She notes no major problems with cognition .   However, her focus/attention is poor at times.     She occasionally has trouble with recall.    Learning new things seems harder.     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   She is on Imuran.      Shingles:   She had shingles late August 2016 had allodynia/dysesthesia in her left thigh x many months but it finally resolved and she is off gabapentin.    She also had shingles at age 42.     MS history: She was diagnosed with MS in 1993 after presenting with left optic neuritis. MRI was consistent with MS and she was started on Betaseron due to a lapse in insurance, she went 2 years without treatment and restarted  round 2000. At some point she was on Avonex and Copaxone. He had injection reactions with both of those medications. Gilenya was tried but she had liver failure (this may have been due to and Augmentin allergy as she was on both medications at the same time).   She was placed on Imuran for autoimmune hepatitis she did not resume any MS medication at that time.   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   Prednisone was also started    REVIEW OF SYSTEMS: Constitutional: No fevers, chills, sweats, or change in appetite.   She has fatigue Eyes: No visual changes, double vision, eye pain Ear, nose and throat: No hearing loss, ear pain, nasal congestion, sore throat Cardiovascular: No chest pain, palpitations Respiratory: No shortness of breath at rest or with exertion.   No wheezes GastrointestinaI: She has autoimmune hepatitis.  No nausea, vomiting, diarrhea, abdominal pain, fecal incontinence Genitourinary: No dysuria, urinary retention or frequency.  No nocturia. Musculoskeletal: No neck pain, back pain Integumentary: No rash, pruritus, skin lesions Neurological: as above Psychiatric: No depression at this time.  No anxiety Endocrine: No palpitations, diaphoresis, change in appetite, change in weigh or increased thirst Hematologic/Lymphatic: No anemia, purpura, petechiae. Allergic/Immunologic: No itchy/runny eyes, nasal congestion, recent allergic reactions, rashes  ALLERGIES: Allergies  Allergen Reactions  . Amoxicillin-Pot Clavulanate Other (See Comments)    Pt. States the doctors think this is what caused her liver failure  . Codeine Nausea And Vomiting  . Carbamazepine Rash    HOME MEDICATIONS:  Current Outpatient Medications:  .  atorvastatin (LIPITOR) 10 MG tablet, Take 10 mg by mouth daily., Disp: , Rfl: 1 .  azaTHIOprine (IMURAN) 50 MG tablet, Take 50 mg by mouth daily., Disp: , Rfl:  .  FLUoxetine (PROZAC) 20 MG  capsule, , Disp: , Rfl:  .  levothyroxine (SYNTHROID, LEVOTHROID) 75 MCG tablet, Take 75 mcg by mouth daily., Disp: , Rfl: 1 .  modafinil (PROVIGIL) 200 MG tablet, Take 1 tablet (200 mg total) by mouth daily., Disp: 30 tablet, Rfl: 5 .  clonazePAM (KLONOPIN) 0.5 MG tablet, by mouth as needed, Disp: , Rfl:  .  imipramine (TOFRANIL) 25 MG tablet, Take 1 tablet (25 mg total) by mouth at bedtime., Disp: 30 tablet, Rfl: 5 .  temazepam (RESTORIL) 15 MG capsule, Take 1 capsule (15 mg total) by mouth at bedtime., Disp: 30 capsule, Rfl: 5  PAST MEDICAL HISTORY: Past Medical History:  Diagnosis Date  . Autoimmune hepatitis (HCC)   . Headache   . Liver disease   . Multiple sclerosis (HCC)   . Vision abnormalities     PAST SURGICAL HISTORY: Past Surgical History:  Procedure Laterality Date  . ANKLE SURGERY  Approx. 2006  . discectomy     L4-L5  . LAPAROSCOPIC APPENDECTOMY    . LAPAROSCOPIC OOPHERECTOMY Right 1991  . TUBAL LIGATION  1991  . VESICOVAGINAL FISTULA CLOSURE W/ TAH      FAMILY HISTORY: Family History  Problem Relation Age of Onset  . Heart attack Mother   . High Cholesterol Mother   . Hypertension Mother   . Diabetes type II Mother   . Heart attack Father   . High Cholesterol Father   . Hypertension Father   . Lupus Sister   . Heart failure Sister   . Seizures Sister   . Pancreatitis Brother   . Multiple sclerosis Maternal Aunt     SOCIAL HISTORY:  Social History   Socioeconomic History  . Marital status: Married    Spouse name: Not on file  . Number of children: Not on file  . Years of education: Not on file  . Highest education level: Not on file  Social Needs  . Financial resource strain: Not on file  . Food insecurity - worry: Not on file  . Food insecurity - inability: Not on file  . Transportation needs - medical: Not on file  . Transportation needs - non-medical: Not on file  Occupational History  . Not on file  Tobacco Use  . Smoking status:  Never Smoker  . Smokeless tobacco: Never Used  Substance and Sexual Activity  . Alcohol use: No    Alcohol/week: 0.0 oz  . Drug use: No  . Sexual activity: Not on file  Other Topics Concern  . Not on file  Social History Narrative  . Not on file     PHYSICAL EXAM  Vitals:   01/01/18 1406  BP: 127/69  Pulse: 60  Resp: 18  Weight: 207 lb (93.9 kg)  Height: 5\' 3"  (1.6 m)    Body mass index is 36.67 kg/m.   General: The patient is well-developed and well-nourished and in no acute distress.  She is moderately  tender over the splenius capitis/occipital nerves, left = right   Neurologic Exam  Mental status: The patient is alert and oriented x 3 at the time of the examination. The patient has apparent normal recent and remote memory, with an apparently normal attention span and concentration ability.   Speech is normal.  Cranial nerves: Extraocular movements are full. Pupil exam shows a 1+ left APD. Color vision, however, is symmetric.  Facial strength and sensation is normal. Trapezius and sternocleidomastoid strength is normal. No dysarthria is noted.  The tongue is midline, and the patient has symmetric elevation of the soft palate. No obvious hearing deficits are noted.  Motor:  Muscle bulk is normal.   Tone is normal. Strength is  5 / 5 in all 4 extremities.   Sensory: Sensory testing shows mild reduced touch sensation and allodynia in the back and the left lower leg.   Intact to pinprick, soft touch and vibration sensation in arms and right leg  Coordination: Cerebellar testing reveals good finger-nose-finger and mildly reduced heel-to-shin bilaterally.  Gait and station: Station is normal.  The gait is fairly normal but the tandem gait is wide. . Romberg is negative.   Reflexes: Deep tendon reflexes are symmetric and increased at knees bilaterally.       DIAGNOSTIC DATA (LABS, IMAGING, TESTING) - I reviewed patient records, labs, notes, testing and imaging myself  where available.      ASSESSMENT AND PLAN  Multiple sclerosis (HCC)  Autoimmune hepatitis (HCC)  Gait disturbance  Other fatigue  Chronic migraine  Neck pain   1.    She will remain off of a disease modifying therapy. She is on Imuran for an autoimmune liver disorder and that should have some benefit for MS. If she does have breakthrough disease, I would add Copaxone as it would be unlikely to cause any negative effects for her liver.  2.   Bilateral splenius capitis trigger point injections with 80 mg Depo-Medrol in 3 mL Marcaine. She tolerated the procedure well and pain was better afterwards.  3.   Return in 6 months or sooner if there are new or worsening neurologic symptoms.  Anorah Trias A. Epimenio Foot, MD, PhD 01/01/2018, 5:40 PM Certified in Neurology, Clinical Neurophysiology, Sleep Medicine, Pain Medicine and Neuroimaging  [facility] [address], [address] [place], [place] [address] [phone]

## 2018-01-02 ENCOUNTER — Telehealth: Payer: Self-pay | Admitting: Neurology

## 2018-01-02 MED ORDER — LEVETIRACETAM 500 MG PO TABS: 500.0000 mg | ORAL_TABLET | Freq: Two times a day (BID) | ORAL | 1 refills | Status: DC

## 2018-01-02 NOTE — Telephone Encounter (Signed)
Pt calling to inform that the medication Dr Epimenio Foot called in for her according to the pharmacist is very similar to Carbatrol and she is allergic to that and she did not pick up what was called in for her.  Pt is asking for a returned call please

## 2018-01-02 NOTE — Telephone Encounter (Signed)
Spoke with Dennison Bulla, pharmacist and RAS.  Due to structural similarity b/t Carbatrol and Imipramine, (she has rash with Carbatrol), pt. will not start Imipramine; instead will start Keppra 500mg  po bid.  New rx. escribed to [facility] and pharmacist aware not to fill Imipramine.  Pt. agreeable with this plan/fim

## 2018-01-02 NOTE — Addendum Note (Signed)
Addended by: Candis Schatz I on: 01/02/2018 04:23 PM   Modules accepted: Orders

## 2018-01-02 NOTE — Addendum Note (Signed)
Addended by: Candis Schatz I on: 01/02/2018 04:28 PM   Modules accepted: Orders

## 2018-01-22 ENCOUNTER — Telehealth: Payer: Self-pay | Admitting: Neurology

## 2018-01-22 NOTE — Telephone Encounter (Signed)
Spoke with [employer].  She started Keppra 750mg  po bid 2-3 wks. ago for migraine prevention. Rash to hands/arms presented a few days ago, getting worse.  Last dose of Keppra was last night, and I have advised she stop it. Previously was taking Topamax 100mg  bid but felt it had become less effective, as h/a's were back to 3-4 times per wk. Also allergic to Carbatrol (rash). On Prozac for work related anxiety and feels this is under control.  Spoke with RAS, and received v/o for Imipramine 25mg  po qhs. (continue Prozac as well).  LMOM for pt. to call and will give this option./fim

## 2018-01-22 NOTE — Telephone Encounter (Signed)
Pt has called asking to speak with RN Faith because she feels she is having a reaction to levETIRAcetam (KEPPRA) 500 MG tablet please call

## 2018-01-23 MED ORDER — IMIPRAMINE HCL 25 MG PO TABS: 25.0000 mg | ORAL_TABLET | Freq: Every day | ORAL | 1 refills | Status: AC

## 2018-01-23 NOTE — Telephone Encounter (Signed)
Spoke with Allison Hanson and offered Imipramine 25mg  qhs for h/a prevention. She is concerned about potential wt. gain with antidepressants, but is willing to try it and call back if she notes wt. gain. Rx[facility]/fim

## 2018-01-23 NOTE — Addendum Note (Signed)
Addended by: Candis Schatz I on: 01/23/2018 11:34 AM   Modules accepted: Orders

## 2018-01-26 MED ORDER — TIZANIDINE HCL 2 MG PO TABS: 2.0000 mg | ORAL_TABLET | Freq: Three times a day (TID) | ORAL | 1 refills | Status: DC | PRN

## 2018-01-26 NOTE — Telephone Encounter (Signed)
Pt is having involuntary muscle spams that started earlier this week progressively getting worse. It is waking her up during the night. Pt has not started imipramine. Please call to advise

## 2018-01-26 NOTE — Addendum Note (Signed)
Addended by: Candis Schatz I on: 01/26/2018 11:50 AM   Modules accepted: Orders

## 2018-01-26 NOTE — Telephone Encounter (Signed)
Spoke with [employer]. She has new c/o generalized muscle twitches.  Last Topamax/Keppra was 5 days ago. Per RAS, ok for Tizanidine 2mg  po tid prn.  Pt. agreeable, and f/u appt. moved up to March. Rx. [facility]/fim

## 2018-02-23 ENCOUNTER — Telehealth: Payer: Self-pay | Admitting: *Deleted

## 2018-02-23 NOTE — Telephone Encounter (Signed)
LMOM (identified vm).  I need to r/sher 03/06/18 appt. with RAS.  He will be out of the office that day/fim

## 2018-02-26 ENCOUNTER — Telehealth: Payer: Self-pay | Admitting: *Deleted

## 2018-02-26 MED ORDER — TIZANIDINE HCL 2 MG PO TABS: 2.0000 mg | ORAL_TABLET | Freq: Three times a day (TID) | ORAL | 1 refills | Status: AC | PRN

## 2018-02-26 NOTE — Telephone Encounter (Signed)
Tizanidine converted to a 90 day rx. per faxed request from [facility]/fim

## 2018-03-06 ENCOUNTER — Ambulatory Visit: Payer: Self-pay | Admitting: Neurology

## 2018-07-03 ENCOUNTER — Ambulatory Visit: Payer: BLUE CROSS/BLUE SHIELD | Admitting: Neurology
